# Patient Record
Sex: Female | Born: 1945 | Hispanic: Yes | Marital: Single | Smoking: Former smoker
Health system: Southern US, Community
[De-identification: ages and names within clinical notes are randomized; demographics above are authoritative.]

## PROBLEM LIST (undated history)

## (undated) ENCOUNTER — Emergency Department (HOSPITAL_COMMUNITY): Admission: EM | Payer: Self-pay | Source: Home / Self Care

## (undated) DIAGNOSIS — R011 Cardiac murmur, unspecified: Secondary | ICD-10-CM

## (undated) DIAGNOSIS — M1990 Unspecified osteoarthritis, unspecified site: Secondary | ICD-10-CM

## (undated) DIAGNOSIS — I10 Essential (primary) hypertension: Secondary | ICD-10-CM

## (undated) DIAGNOSIS — F419 Anxiety disorder, unspecified: Secondary | ICD-10-CM

## (undated) HISTORY — DX: Unspecified osteoarthritis, unspecified site: M19.90

## (undated) HISTORY — DX: Anxiety disorder, unspecified: F41.9

## (undated) HISTORY — DX: Essential (primary) hypertension: I10

## (undated) HISTORY — PX: ABDOMINAL HYSTERECTOMY: SHX81

## (undated) HISTORY — DX: Cardiac murmur, unspecified: R01.1

---

## 2014-01-04 DIAGNOSIS — I10 Essential (primary) hypertension: Secondary | ICD-10-CM | POA: Diagnosis not present

## 2014-01-04 DIAGNOSIS — R001 Bradycardia, unspecified: Secondary | ICD-10-CM | POA: Diagnosis not present

## 2014-03-15 ENCOUNTER — Ambulatory Visit: Payer: Self-pay

## 2014-03-15 ENCOUNTER — Telehealth: Payer: Self-pay

## 2014-03-15 ENCOUNTER — Ambulatory Visit (INDEPENDENT_AMBULATORY_CARE_PROVIDER_SITE_OTHER): Payer: Medicare Other

## 2014-03-15 ENCOUNTER — Ambulatory Visit (INDEPENDENT_AMBULATORY_CARE_PROVIDER_SITE_OTHER): Payer: Medicare Other | Admitting: Family Medicine

## 2014-03-15 VITALS — BP 116/70 | HR 62 | Temp 98.1°F | Resp 18 | Ht 63.5 in | Wt 166.0 lb

## 2014-03-15 DIAGNOSIS — R5381 Other malaise: Secondary | ICD-10-CM | POA: Diagnosis not present

## 2014-03-15 DIAGNOSIS — I10 Essential (primary) hypertension: Secondary | ICD-10-CM | POA: Diagnosis not present

## 2014-03-15 DIAGNOSIS — R059 Cough, unspecified: Secondary | ICD-10-CM

## 2014-03-15 DIAGNOSIS — R05 Cough: Secondary | ICD-10-CM

## 2014-03-15 DIAGNOSIS — J101 Influenza due to other identified influenza virus with other respiratory manifestations: Secondary | ICD-10-CM | POA: Diagnosis not present

## 2014-03-15 LAB — POCT CBC
Granulocyte percent: 45.1 %G (ref 37–80)
HCT, POC: 42.3 % (ref 37.7–47.9)
HEMOGLOBIN: 13.7 g/dL (ref 12.2–16.2)
LYMPH, POC: 2.9 (ref 0.6–3.4)
MCH, POC: 30.7 pg (ref 27–31.2)
MCHC: 32.5 g/dL (ref 31.8–35.4)
MCV: 94.3 fL (ref 80–97)
MID (cbc): 0.3 (ref 0–0.9)
MPV: 8.1 fL (ref 0–99.8)
POC Granulocyte: 2.7 (ref 2–6.9)
POC LYMPH PERCENT: 49.7 %L (ref 10–50)
POC MID %: 5.2 %M (ref 0–12)
Platelet Count, POC: 226 10*3/uL (ref 142–424)
RBC: 4.48 M/uL (ref 4.04–5.48)
RDW, POC: 12.8 %
WBC: 5.9 10*3/uL (ref 4.6–10.2)

## 2014-03-15 LAB — POCT INFLUENZA A/B
INFLUENZA A, POC: POSITIVE
INFLUENZA B, POC: NEGATIVE

## 2014-03-15 MED ORDER — HYDROCODONE-HOMATROPINE 5-1.5 MG/5ML PO SYRP
5.0000 mL | ORAL_SOLUTION | Freq: Two times a day (BID) | ORAL | Status: DC | PRN
Start: 2014-03-15 — End: 2015-06-08

## 2014-03-15 MED ORDER — OSELTAMIVIR PHOSPHATE 75 MG PO CAPS: 75.0000 mg | ORAL_CAPSULE | Freq: Two times a day (BID) | ORAL | Status: DC

## 2014-03-15 NOTE — Patient Instructions (Signed)
You do have the flu- use the tamiflu as directed, rest, drink plenty of liquids, and use the cough syrup as needed.   The cough syrup will make you feel sleepy so do not use it when you need to drive.  However it is ok to use ibuprofen or tylenol as needed as well Let me know if you do not feel improved in 2-3 days

## 2014-03-15 NOTE — Progress Notes (Addendum)
[facility] [address], [place] [address] [phone]  Date:  03/15/2014   Name:  Danielle Stone   DOB:  11/13/1945   MRN:  [id number]  PCP:  No PCP Per Patient    Chief Complaint: Chills; Emesis; Generalized Body Aches; Sore Throat; and Cough   History of Present Illness:  Danielle Stone is a 69 y.o. very pleasant female patient who presents with the following:  She is here today with illness- today is Monday and she noted sx on Saturday. She first noted chills, subjective fever, ST and cough.  It hurts a lot to cough. Her body hurts.  She has vomited twice and has also noted diarrhea.   She has tried some tylenol and excedrin for HA.   No medications so far today No sick contacts at home.  She recently [place] from [place]  There are no active problems to display for this patient.   Past Medical History  Diagnosis Date  . Anxiety   . Arthritis   . Heart murmur   . Hypertension     Past Surgical History  Procedure Laterality Date  . Abdominal hysterectomy      History  Substance Use Topics  . Smoking status: Former Games developermoker  . Smokeless tobacco: Not on file  . Alcohol Use: No    Family History  Problem Relation Age of Onset  . Cancer Mother   . Diabetes Mother   . Heart disease Mother   . Hyperlipidemia Mother   . Hypertension Mother   . Diabetes Sister     No Known Allergies  Medication list has been reviewed and updated.  No current outpatient prescriptions on file prior to visit.   No current facility-administered medications on file prior to visit.    Review of Systems:  As per HPI- otherwise negative.   Physical Examination: Filed Vitals:   03/15/14 1315  BP: 116/70  Pulse: 62  Temp: 98.1 F (36.7 C)  Resp: 18   Filed Vitals:   03/15/14 1315  Height: 5' 3.5" (1.613 m)  Weight: 166 lb (75.297 kg)   Body mass index is 28.94 kg/(m^2). Ideal Body Weight: Weight in (lb) to have BMI  = 25: 143.1  GEN: WDWN, NAD, Non-toxic, A & O x 3, overweight HEENT: Atraumatic, Normocephalic. Neck supple. No masses, No LAD.  Bilateral TM wnl, oropharynx normal.  PEERL,EOMI.   Ears and Nose: No external deformity. CV: RRR, No M/G/R. No JVD. No thrill. No extra heart sounds. PULM: CTA B, no wheezes. No retractions. No resp. distress. No accessory muscle use. Chest is congested.   ABD: S, NT, ND. No rebound. No HSM.  Benign exam EXTR: No c/c/e NEURO Normal gait.  PSYCH: Normally interactive. Conversant. Not depressed or anxious appearing.  Calm demeanor.   [facility] reading (PRIMARY) by  Dr. Patsy Lageropland. CXR: negative CHEST 2 VIEW  COMPARISON: None.  FINDINGS: The cardiac shadow is within normal limits. The lungs are well aerated bilaterally. Artifact is noted over the lower aspect of the image. No focal infiltrate or sizable effusion is seen. No bony abnormality is noted.  IMPRESSION: No acute abnormality seen.  Results for orders placed or performed in visit on 03/15/14  POCT CBC  Result Value Ref Range   WBC 5.9 4.6 - 10.2 K/uL   Lymph, poc 2.9 0.6 - 3.4   POC LYMPH PERCENT 49.7 10 - 50 %L   MID (cbc) 0.3 0 - 0.9  POC MID % 5.2 0 - 12 %M   POC Granulocyte 2.7 2 - 6.9   Granulocyte percent 45.1 37 - 80 %G   RBC 4.48 4.04 - 5.48 M/uL   Hemoglobin 13.7 12.2 - 16.2 g/dL   HCT, POC 08.6 57.8 - 47.9 %   MCV 94.3 80 - 97 fL   MCH, POC 30.7 27 - 31.2 pg   MCHC 32.5 31.8 - 35.4 g/dL   RDW, POC 46.9 %   Platelet Count, POC 226 142 - 424 K/uL   MPV 8.1 0 - 99.8 fL  POCT Influenza A/B  Result Value Ref Range   Influenza A, POC Positive    Influenza B, POC Negative       Assessment and Plan: Influenza A - Plan: oseltamivir (TAMIFLU) 75 MG capsule  Cough - Plan: POCT CBC, POCT Influenza A/B, DG Chest 2 View, HYDROcodone-homatropine (HYCODAN) 5-1.5 MG/5ML syrup  Malaise - Plan: POCT CBC, POCT Influenza A/B, DG Chest 2 View  Treat for the flu with tamiflu and hycodan as  needed See patient instructions for more details.   She will let me know if not better soon  Signed Abbe Amsterdam, MD

## 2014-03-15 NOTE — Telephone Encounter (Signed)
Patient lost her gold ear ring today (seen at [address])  If found call [phone]

## 2014-03-19 NOTE — Telephone Encounter (Signed)
Called patient to let her know the gold earring was not found at [address].

## 2014-07-29 DIAGNOSIS — J209 Acute bronchitis, unspecified: Secondary | ICD-10-CM | POA: Diagnosis not present

## 2014-07-29 DIAGNOSIS — I10 Essential (primary) hypertension: Secondary | ICD-10-CM | POA: Diagnosis not present

## 2014-07-29 DIAGNOSIS — E785 Hyperlipidemia, unspecified: Secondary | ICD-10-CM | POA: Diagnosis not present

## 2014-07-29 DIAGNOSIS — R5382 Chronic fatigue, unspecified: Secondary | ICD-10-CM | POA: Diagnosis not present

## 2014-07-29 DIAGNOSIS — J029 Acute pharyngitis, unspecified: Secondary | ICD-10-CM | POA: Diagnosis not present

## 2014-07-29 DIAGNOSIS — R05 Cough: Secondary | ICD-10-CM | POA: Diagnosis not present

## 2014-07-29 DIAGNOSIS — R5383 Other fatigue: Secondary | ICD-10-CM | POA: Diagnosis not present

## 2015-06-02 ENCOUNTER — Emergency Department (HOSPITAL_COMMUNITY)
Admission: EM | Admit: 2015-06-02 | Discharge: 2015-06-02 | Disposition: A | Discharge status: Home / Self Care | Payer: Medicare Other | Attending: Emergency Medicine | Admitting: Emergency Medicine

## 2015-06-02 ENCOUNTER — Emergency Department (HOSPITAL_COMMUNITY): Payer: Medicare Other

## 2015-06-02 ENCOUNTER — Encounter (HOSPITAL_COMMUNITY): Payer: Self-pay | Admitting: *Deleted

## 2015-06-02 DIAGNOSIS — R001 Bradycardia, unspecified: Secondary | ICD-10-CM | POA: Diagnosis not present

## 2015-06-02 DIAGNOSIS — R030 Elevated blood-pressure reading, without diagnosis of hypertension: Secondary | ICD-10-CM | POA: Diagnosis not present

## 2015-06-02 DIAGNOSIS — I10 Essential (primary) hypertension: Secondary | ICD-10-CM | POA: Insufficient documentation

## 2015-06-02 DIAGNOSIS — R42 Dizziness and giddiness: Secondary | ICD-10-CM | POA: Diagnosis not present

## 2015-06-02 DIAGNOSIS — Z79899 Other long term (current) drug therapy: Secondary | ICD-10-CM | POA: Diagnosis not present

## 2015-06-02 DIAGNOSIS — R011 Cardiac murmur, unspecified: Secondary | ICD-10-CM | POA: Insufficient documentation

## 2015-06-02 DIAGNOSIS — I159 Secondary hypertension, unspecified: Secondary | ICD-10-CM

## 2015-06-02 DIAGNOSIS — Z8659 Personal history of other mental and behavioral disorders: Secondary | ICD-10-CM | POA: Diagnosis not present

## 2015-06-02 DIAGNOSIS — R0602 Shortness of breath: Secondary | ICD-10-CM | POA: Diagnosis not present

## 2015-06-02 DIAGNOSIS — Z87891 Personal history of nicotine dependence: Secondary | ICD-10-CM | POA: Insufficient documentation

## 2015-06-02 DIAGNOSIS — R51 Headache: Secondary | ICD-10-CM | POA: Insufficient documentation

## 2015-06-02 DIAGNOSIS — M1990 Unspecified osteoarthritis, unspecified site: Secondary | ICD-10-CM | POA: Insufficient documentation

## 2015-06-02 LAB — CBC WITH DIFFERENTIAL/PLATELET
BASOS ABS: 0 10*3/uL (ref 0.0–0.1)
BASOS PCT: 0 %
Eosinophils Absolute: 0.3 10*3/uL (ref 0.0–0.7)
Eosinophils Relative: 3 %
HEMATOCRIT: 39.9 % (ref 36.0–46.0)
HEMOGLOBIN: 13.2 g/dL (ref 12.0–15.0)
Lymphocytes Relative: 39 %
Lymphs Abs: 3.3 10*3/uL (ref 0.7–4.0)
MCH: 30.7 pg (ref 26.0–34.0)
MCHC: 33.1 g/dL (ref 30.0–36.0)
MCV: 92.8 fL (ref 78.0–100.0)
Monocytes Absolute: 0.4 10*3/uL (ref 0.1–1.0)
Monocytes Relative: 5 %
NEUTROS ABS: 4.6 10*3/uL (ref 1.7–7.7)
NEUTROS PCT: 53 %
Platelets: 261 10*3/uL (ref 150–400)
RBC: 4.3 MIL/uL (ref 3.87–5.11)
RDW: 12.3 % (ref 11.5–15.5)
WBC: 8.6 10*3/uL (ref 4.0–10.5)

## 2015-06-02 LAB — BASIC METABOLIC PANEL
ANION GAP: 12 (ref 5–15)
BUN: 11 mg/dL (ref 6–20)
CALCIUM: 9.3 mg/dL (ref 8.9–10.3)
CO2: 25 mmol/L (ref 22–32)
Chloride: 104 mmol/L (ref 101–111)
Creatinine, Ser: 0.85 mg/dL (ref 0.44–1.00)
GFR calc non Af Amer: >60 mL/min (ref >60)
Glucose, Bld: 100 mg/dL — ABNORMAL HIGH (ref 65–99)
Potassium: 3.7 mmol/L (ref 3.5–5.1)
SODIUM: 141 mmol/L (ref 135–145)

## 2015-06-02 MED ORDER — DIPHENHYDRAMINE HCL 50 MG/ML IJ SOLN: 25.0000 mg | Freq: Once | INTRAMUSCULAR | Status: DC

## 2015-06-02 MED ORDER — SODIUM CHLORIDE 0.9 % IV BOLUS (SEPSIS): 1000.0000 mL | Freq: Once | INTRAVENOUS | Status: DC

## 2015-06-02 MED ORDER — METOCLOPRAMIDE HCL 5 MG/ML IJ SOLN: 10.0000 mg | Freq: Once | INTRAMUSCULAR | Status: DC

## 2015-06-02 MED ORDER — LORAZEPAM 2 MG/ML IJ SOLN
0.5000 mg | Freq: Once | INTRAMUSCULAR | Status: AC
  Administered 2015-06-02: 0.5 mg via INTRAVENOUS
  Filled 2015-06-02: qty 1

## 2015-06-02 MED ORDER — LISINOPRIL-HYDROCHLOROTHIAZIDE 20-25 MG PO TABS: 1.0000 | ORAL_TABLET | Freq: Every day | ORAL | Status: AC

## 2015-06-02 MED ORDER — GADOBENATE DIMEGLUMINE 529 MG/ML IV SOLN
15.0000 mL | Freq: Once | INTRAVENOUS | Status: AC | PRN
  Administered 2015-06-02: 15 mL via INTRAVENOUS

## 2015-06-02 NOTE — ED Provider Notes (Signed)
[place] less sided temporal headache sudden onset 5 PM today accompanied by nausea and vertigo. Headache is presently gone and she is asymptomatic. She admits to noncompliance with her lisinopril-HCTZ for 10 days as she ran out. On exam patient alert no distress HEENT exam no facial asymmetry neck supple trachea midline no bruit lungs clearheart regular rate and rhythm abdomen obese, nontender extremities no edema. Neurologic cranial nerves II through XII grossly intact. She moves all extremities motor is 5 over 5 overall upon attempting to stand she becomes extremely vertiginous and could not stand  Doug SouSam Amarionna Arca, MD 06/02/15 2336

## 2015-06-02 NOTE — ED Provider Notes (Signed)
CSN: [id number]     Arrival date & time 06/02/15  1934 History   First MD Initiated Contact with Patient 06/02/15 1937     Chief Complaint  Patient presents with  . Hypertension  . Dizziness     (Consider location/radiation/quality/duration/timing/severity/associated sxs/prior Treatment) Patient is a 70 y.o. female presenting with general illness. The history is provided by the patient.  Illness Location:  Headache, lightheadedness Severity:  Moderate Onset quality:  Gradual Duration:  1 day Timing:  Constant Progression:  Unchanged Chronicity:  New Context:  History of hypertension. Has not taken her medications for 10 days. 2 days of lightheadedness. Onset of headache today. Endorses shortness of breath, no chest pain. EMS called. Reported that multiple measurements had systolic blood pressure greater than 290. Associated symptoms: headaches and shortness of breath   Associated symptoms: no abdominal pain, no chest pain, no cough, no diarrhea, no fever, no nausea and no vomiting     Past Medical History  Diagnosis Date  . Anxiety   . Arthritis   . Heart murmur   . Hypertension    Past Surgical History  Procedure Laterality Date  . Abdominal hysterectomy     Family History  Problem Relation Age of Onset  . Cancer Mother   . Diabetes Mother   . Heart disease Mother   . Hyperlipidemia Mother   . Hypertension Mother   . Diabetes Sister    Social History  Substance Use Topics  . Smoking status: Former Games developer  . Smokeless tobacco: None  . Alcohol Use: No   OB History    No data available     Review of Systems  Constitutional: Negative for fever and chills.  Eyes: Negative for visual disturbance.  Respiratory: Positive for shortness of breath. Negative for cough.   Cardiovascular: Negative for chest pain.  Gastrointestinal: Negative for nausea, vomiting, abdominal pain and diarrhea.  Musculoskeletal: Negative for back pain, neck pain and neck stiffness.   Neurological: Positive for light-headedness and headaches. Negative for syncope, facial asymmetry, speech difficulty, weakness and numbness.  All other systems reviewed and are negative.     Allergies  Review of patient's allergies indicates no known allergies.  Home Medications   Prior to Admission medications   Medication Sig Start Date End Date Taking? Authorizing Provider  HYDROcodone-homatropine (HYCODAN) 5-1.5 MG/5ML syrup Take 5 mLs by mouth 2 (two) times daily as needed for cough. 03/15/14   Gwenlyn Found Copland, MD  lisinopril-hydrochlorothiazide (PRINZIDE,ZESTORETIC) 20-25 MG per tablet Take 1 tablet by mouth daily.    Historical Provider, MD  oseltamivir (TAMIFLU) 75 MG capsule Take 1 capsule (75 mg total) by mouth 2 (two) times daily. 03/15/14   Gwenlyn Found Copland, MD   BP 168/98 mmHg  Pulse 48  Temp(Src) 97.4 F (36.3 C) (Oral)  SpO2 95% Physical Exam  Constitutional: She is oriented to person, place, and time. She appears well-developed and well-nourished. No distress.  HENT:  Head: Normocephalic and atraumatic.  Eyes: EOM are normal. Pupils are equal, round, and reactive to light.  Neck: Normal range of motion. No rigidity. Normal range of motion present.  Cardiovascular: Regular rhythm.  Bradycardia present.   Pulmonary/Chest: No tachypnea. No respiratory distress.  Abdominal: Soft. Normal appearance. There is no tenderness.  Neurological: She is alert and oriented to person, place, and time. GCS eye subscore is 4. GCS verbal subscore is 5. GCS motor subscore is 6.  Skin: Skin is warm and dry.    ED Course  Procedures (  including critical care time) Labs Review Labs Reviewed - No data to display  Imaging Review No results found. I have personally reviewed and evaluated these images and lab results as part of my medical decision-making.   EKG Interpretation   Date/Time:  Thursday Jun 02 2015 19:39:17 EDT Ventricular Rate:  49 PR Interval:  141 QRS Duration:  94 QT Interval:  476 QTC Calculation: 430 R Axis:   44 Text Interpretation:  Sinus bradycardia Borderline T abnormalities,  anterior leads No old tracing to compare Confirmed by Ethelda ChickJACUBOWITZ  MD, SAM  [phone]([id number]) on 06/02/2015 7:41:24 PM      MDM   Final diagnoses:  Secondary hypertension, unspecified  Lightheadedness    70 year old female with only medical history of hypertension presenting with lightheadedness and headache. Hasn't been taking her medications. Lightheadedness for 1 day. Abrupt onset of headache 3 hours prior to arrival. Denies vision changes. Denies fevers or chills. Per EMS had a blood pressure of systolic greater than 290. Blood pressure here in the systolic 160s on arrival. Mild bradycardia. Endorses shortness of breath, no chest pain. Did not receive any interventions. Overall the patient appears well. No meningeal signs. Doubt sepsis or meningitis. No focal neurological deficits. Headache now resolved. I tried to ambulate the patient, unable to stand 2/2 to lightheadedness, vertigo.  Getting a MRI head to evaluate for intracranial bleed versus posterior infarct, and chest x-ray. EKG pending. We'll give the patient medications for her headache should it return.Maryclare Labrador. We'll reevaluate after those return.  MRI showed no acute pathology. No evidence of intracranial bleed or acute infarct. Doubt CVA.  I reevaluated the patient. She is reporting she's had resolution of her lightheadedness and vertiginous symptoms. Does not have a headache either. Vital signs are stable. Able to ambulate throughout the emergency department without issue.  After discussion with the patient and family member the patient feels comfortable with going home. We'll give her prescription for her antihypertensive. We'll have her follow-up with family primary care doctor as soon as possible. Patient is also mentioning starting some dieting pills recently. Strongly encouraged her to stop taking these.  Strict  return precautions provided. Patient discharged in stable condition.  Lindalou HoseSean O'Rourke, MD 06/02/15 2349  Doug SouSam Jacubowitz, MD 06/03/15 [id number]

## 2015-06-02 NOTE — ED Notes (Signed)
Pt presents via [facility] from home c/o dizziness andHTN.  Pt has been out of medications x 10 days d/t losing medicare card.  Initial BP 290/166 by EMS and 290/130, 224 palp.  Last BP-162/72, 40s SB.   No neuro deficits noted. Pt a x 4, NAD.

## 2015-06-08 ENCOUNTER — Emergency Department (HOSPITAL_COMMUNITY)
Admission: EM | Admit: 2015-06-08 | Discharge: 2015-06-09 | Disposition: A | Discharge status: Home / Self Care | Payer: Medicare Other | Attending: Emergency Medicine | Admitting: Emergency Medicine

## 2015-06-08 ENCOUNTER — Encounter (HOSPITAL_COMMUNITY): Payer: Self-pay | Admitting: Emergency Medicine

## 2015-06-08 DIAGNOSIS — Z87891 Personal history of nicotine dependence: Secondary | ICD-10-CM | POA: Insufficient documentation

## 2015-06-08 DIAGNOSIS — Z79899 Other long term (current) drug therapy: Secondary | ICD-10-CM | POA: Diagnosis not present

## 2015-06-08 DIAGNOSIS — R42 Dizziness and giddiness: Secondary | ICD-10-CM

## 2015-06-08 DIAGNOSIS — R011 Cardiac murmur, unspecified: Secondary | ICD-10-CM | POA: Diagnosis not present

## 2015-06-08 DIAGNOSIS — M1990 Unspecified osteoarthritis, unspecified site: Secondary | ICD-10-CM | POA: Diagnosis not present

## 2015-06-08 DIAGNOSIS — Z8659 Personal history of other mental and behavioral disorders: Secondary | ICD-10-CM | POA: Diagnosis not present

## 2015-06-08 DIAGNOSIS — R5383 Other fatigue: Secondary | ICD-10-CM | POA: Diagnosis not present

## 2015-06-08 DIAGNOSIS — R51 Headache: Secondary | ICD-10-CM | POA: Insufficient documentation

## 2015-06-08 DIAGNOSIS — I10 Essential (primary) hypertension: Secondary | ICD-10-CM | POA: Diagnosis not present

## 2015-06-08 DIAGNOSIS — R0602 Shortness of breath: Secondary | ICD-10-CM | POA: Diagnosis not present

## 2015-06-08 LAB — CBC WITH DIFFERENTIAL/PLATELET
BASOS ABS: 0 10*3/uL (ref 0.0–0.1)
Basophils Relative: 0 %
EOS ABS: 0.4 10*3/uL (ref 0.0–0.7)
EOS PCT: 4 %
HCT: 42.8 % (ref 36.0–46.0)
Hemoglobin: 14.4 g/dL (ref 12.0–15.0)
LYMPHS PCT: 48 %
Lymphs Abs: 4.9 10*3/uL — ABNORMAL HIGH (ref 0.7–4.0)
MCH: 31.8 pg (ref 26.0–34.0)
MCHC: 33.6 g/dL (ref 30.0–36.0)
MCV: 94.5 fL (ref 78.0–100.0)
Monocytes Absolute: 0.7 10*3/uL (ref 0.1–1.0)
Monocytes Relative: 7 %
NEUTROS ABS: 4.2 10*3/uL (ref 1.7–7.7)
NEUTROS PCT: 41 %
PLATELETS: 317 10*3/uL (ref 150–400)
RBC: 4.53 MIL/uL (ref 3.87–5.11)
RDW: 12.3 % (ref 11.5–15.5)
WBC: 10.2 10*3/uL (ref 4.0–10.5)

## 2015-06-08 LAB — BASIC METABOLIC PANEL
ANION GAP: 11 (ref 5–15)
BUN: 15 mg/dL (ref 6–20)
CO2: 30 mmol/L (ref 22–32)
Calcium: 9.7 mg/dL (ref 8.9–10.3)
Chloride: 98 mmol/L — ABNORMAL LOW (ref 101–111)
Creatinine, Ser: 0.95 mg/dL (ref 0.44–1.00)
Glucose, Bld: 111 mg/dL — ABNORMAL HIGH (ref 65–99)
POTASSIUM: 3.8 mmol/L (ref 3.5–5.1)
SODIUM: 139 mmol/L (ref 135–145)

## 2015-06-08 NOTE — ED Notes (Signed)
Pt. reports dizziness with mild headache and nausea onset this week , [facility] diagnosed with vertigo , pt. articulated MRI done here last Thursday diagnosed with hypertension discharged home with prescription antihypertensive .

## 2015-06-08 NOTE — ED Provider Notes (Signed)
CSN: [id number]     Arrival date & time 06/08/15  2108 History   First MD Initiated Contact with Patient 06/08/15 2256     Chief Complaint  Patient presents with  . Dizziness    Patient is a 70 y.o. female presenting with dizziness. The history is provided by the patient and a relative.  Dizziness Quality:  Head spinning Severity:  Moderate Onset quality:  Gradual Duration:  6 days Timing:  Intermittent Progression:  Improving Chronicity:  New Worsened by:  Nothing Associated symptoms: headaches and shortness of breath   Associated symptoms: no chest pain, no hearing loss, no syncope and no vomiting   Risk factors: no hx of stroke    Patient presents for continued dizziness She reports that she was seen on 5/11 for HTN and dizziness She had extensive workup including MRI and was discharged home Since that time she has improved but still has episodes of dizziness No syncope She has mild HA She has mild blurred vision but no diplopia No CP/SOB No focal weakness She reports mild SOB at times She reports fatigue  She was seen at her PCP clinic today, told she had vertigo and told to go to ER to get her results from last week  Past Medical History  Diagnosis Date  . Anxiety   . Arthritis   . Heart murmur   . Hypertension    Past Surgical History  Procedure Laterality Date  . Abdominal hysterectomy     Family History  Problem Relation Age of Onset  . Cancer Mother   . Diabetes Mother   . Heart disease Mother   . Hyperlipidemia Mother   . Hypertension Mother   . Diabetes Sister    Social History  Substance Use Topics  . Smoking status: Former Games developer  . Smokeless tobacco: None  . Alcohol Use: No   OB History    No data available     Review of Systems  Constitutional: Positive for fatigue. Negative for fever.  HENT: Negative for hearing loss.   Respiratory: Positive for shortness of breath.   Cardiovascular: Negative for chest pain and syncope.   Gastrointestinal: Negative for vomiting.  Genitourinary: Negative for dysuria.  Neurological: Positive for dizziness and headaches. Negative for syncope.  All other systems reviewed and are negative.     Allergies  Review of patient's allergies indicates no known allergies.  Home Medications   Prior to Admission medications   Medication Sig Start Date End Date Taking? Authorizing Provider  HYDROcodone-homatropine (HYCODAN) 5-1.5 MG/5ML syrup Take 5 mLs by mouth 2 (two) times daily as needed for cough. 03/15/14   Gwenlyn Found Copland, MD  lisinopril-hydrochlorothiazide (PRINZIDE,ZESTORETIC) 20-25 MG tablet Take 1 tablet by mouth daily. 06/02/15   Lindalou Hose, MD  oseltamivir (TAMIFLU) 75 MG capsule Take 1 capsule (75 mg total) by mouth 2 (two) times daily. 03/15/14   Gwenlyn Found Copland, MD   BP 130/82 mmHg  Pulse 55  Temp(Src) 97.4 F (36.3 C) (Oral)  Resp 16  Ht  (1.626 m)  Wt 76.25 kg  BMI 28.84 kg/m2  SpO2 97% Physical Exam CONSTITUTIONAL: Well developed/well nourished HEAD: Normocephalic/atraumatic EYES: EOMI/PERRL, no nystagmus,  no ptosis ENMT: Mucous membranes moist NECK: supple no meningeal signs, no bruits CV: S1/S2 noted, no murmurs/rubs/gallops noted LUNGS: Lungs are clear to auscultation bilaterally, no apparent distress ABDOMEN: soft, nontender, no rebound or guarding GU:no cva tenderness NEURO:Awake/alert, face symmetric, no arm or leg drift is noted Equal 5/5 strength with  shoulder abduction, elbow flex/extension, wrist flex/extension in upper extremities and equal hand grips bilaterally Equal 5/5 strength with hip flexion,knee flex/extension, foot dorsi/plantar flexion Cranial nerves 3/4/5/6/07/30/08/11/12 tested and intact Gait normal without ataxia No past pointing Sensation to light touch intact in all extremities EXTREMITIES: pulses normal, full ROM SKIN: warm, color normal PSYCH: no abnormalities of mood noted  ED Course  Procedures  Labs  Review Labs Reviewed  CBC WITH DIFFERENTIAL/PLATELET - Abnormal; Notable for the following:    Lymphs Abs 4.9 (*)    All other components within normal limits  BASIC METABOLIC PANEL - Abnormal; Notable for the following:    Chloride 98 (*)    Glucose, Bld 111 (*)    All other components within normal limits  TROPONIN I     EKG Interpretation   Date/Time:  Wednesday Jun 08 2015 23:42:58 EDT Ventricular Rate:  49 PR Interval:  150 QRS Duration: 100 QT Interval:  456 QTC Calculation: 412 R Axis:   34 Text Interpretation:  Sinus bradycardia nonspecific st changes No previous  ECGs available Confirmed by Bebe ShaggyWICKLINE  MD, Lowell Mcgurk ([id number]) on 06/08/2015  11:48:03 PM     11:43 PM Pt had negative MRI last week She is improved but still having episodes of vertigo Due to age, and reported fatigue, will also check EKG and troponin   Labs reassuring Pt well appearing She has sinus bradycardia but she reports this is chronic Will give Rx for antivert as this is likely vertigo  MDM   Final diagnoses:  Vertigo    Nursing notes including past medical history and social history reviewed and considered in documentation Labs/vital reviewed myself and considered during evaluation Previous records reviewed and considered     Zadie Rhineonald Charlette Hennings, MD 06/09/15 [phone]

## 2015-06-09 LAB — I-STAT TROPONIN, ED: Troponin i, poc: 0 ng/mL (ref 0.00–0.08)

## 2015-06-09 MED ORDER — MECLIZINE HCL 25 MG PO TABS
25.0000 mg | ORAL_TABLET | Freq: Three times a day (TID) | ORAL | Status: AC | PRN
Start: 2015-06-09 — End: ?

## 2015-07-04 DIAGNOSIS — I10 Essential (primary) hypertension: Secondary | ICD-10-CM | POA: Diagnosis not present

## 2015-08-23 DIAGNOSIS — R7303 Prediabetes: Secondary | ICD-10-CM | POA: Diagnosis not present

## 2015-08-23 DIAGNOSIS — F321 Major depressive disorder, single episode, moderate: Secondary | ICD-10-CM | POA: Diagnosis not present

## 2015-08-23 DIAGNOSIS — I10 Essential (primary) hypertension: Secondary | ICD-10-CM | POA: Diagnosis not present

## 2015-11-21 DIAGNOSIS — Z23 Encounter for immunization: Secondary | ICD-10-CM | POA: Diagnosis not present

## 2015-11-22 DIAGNOSIS — F321 Major depressive disorder, single episode, moderate: Secondary | ICD-10-CM | POA: Diagnosis not present

## 2015-11-22 DIAGNOSIS — G4720 Circadian rhythm sleep disorder, unspecified type: Secondary | ICD-10-CM | POA: Diagnosis not present

## 2016-06-08 DIAGNOSIS — Z0000 Encounter for general adult medical examination without abnormal findings: Secondary | ICD-10-CM | POA: Diagnosis not present

## 2016-06-08 DIAGNOSIS — Z23 Encounter for immunization: Secondary | ICD-10-CM | POA: Diagnosis not present

## 2016-06-08 DIAGNOSIS — F321 Major depressive disorder, single episode, moderate: Secondary | ICD-10-CM | POA: Diagnosis not present

## 2016-06-08 DIAGNOSIS — E785 Hyperlipidemia, unspecified: Secondary | ICD-10-CM | POA: Diagnosis not present

## 2016-06-08 DIAGNOSIS — I10 Essential (primary) hypertension: Secondary | ICD-10-CM | POA: Diagnosis not present

## 2016-06-08 DIAGNOSIS — K5901 Slow transit constipation: Secondary | ICD-10-CM | POA: Diagnosis not present

## 2016-06-08 DIAGNOSIS — R7303 Prediabetes: Secondary | ICD-10-CM | POA: Diagnosis not present

## 2016-06-13 ENCOUNTER — Other Ambulatory Visit: Payer: Self-pay | Admitting: Family Medicine

## 2016-06-13 DIAGNOSIS — Z1231 Encounter for screening mammogram for malignant neoplasm of breast: Secondary | ICD-10-CM

## 2016-07-03 DIAGNOSIS — Z1211 Encounter for screening for malignant neoplasm of colon: Secondary | ICD-10-CM | POA: Diagnosis not present

## 2016-07-03 DIAGNOSIS — K219 Gastro-esophageal reflux disease without esophagitis: Secondary | ICD-10-CM | POA: Diagnosis not present

## 2016-07-20 DIAGNOSIS — E785 Hyperlipidemia, unspecified: Secondary | ICD-10-CM | POA: Diagnosis not present

## 2016-07-20 DIAGNOSIS — F321 Major depressive disorder, single episode, moderate: Secondary | ICD-10-CM | POA: Diagnosis not present

## 2016-09-24 IMAGING — MR MR HEAD WO/W CM
10 of 12 series · 34 of 48 positions shown · IV contrast (multihance)
Comparison: None.

CLINICAL DATA: Dizziness, vertigo. History of hypertension, out of
medication.

EXAM:
MRI HEAD WITHOUT AND WITH CONTRAST
TECHNIQUE: Multiplanar, multiecho pulse sequences of the brain and surrounding
structures were obtained without and with intravenous contrast.
CONTRAST:  15mL MULTIHANCE GADOBENATE DIMEGLUMINE 529 MG/ML IV SOLN

[Series 3: T1 · sagittal · 5.0mm · 0.47mm/px · 3 of 23 slices shown]
[im 1/23]
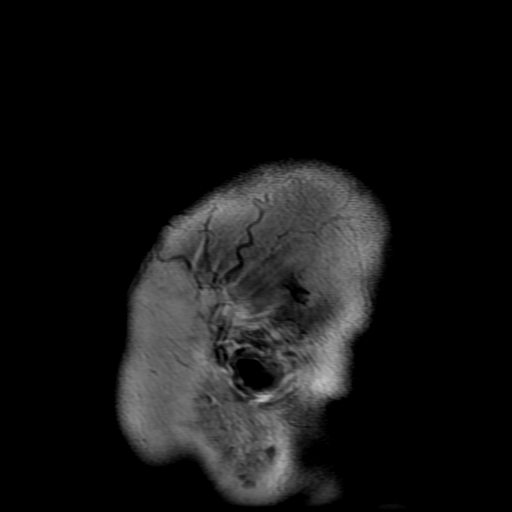
[im 12/23]
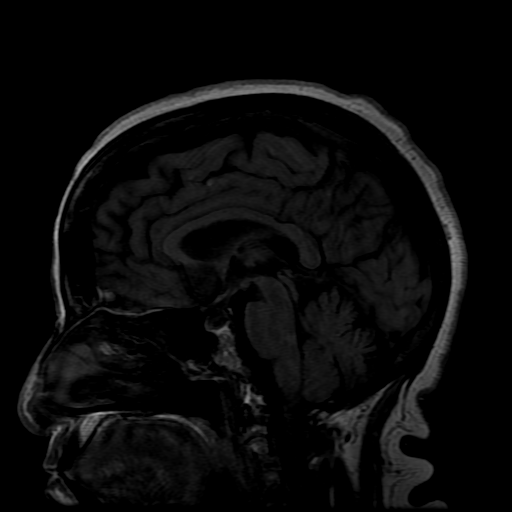
[im 23/23]
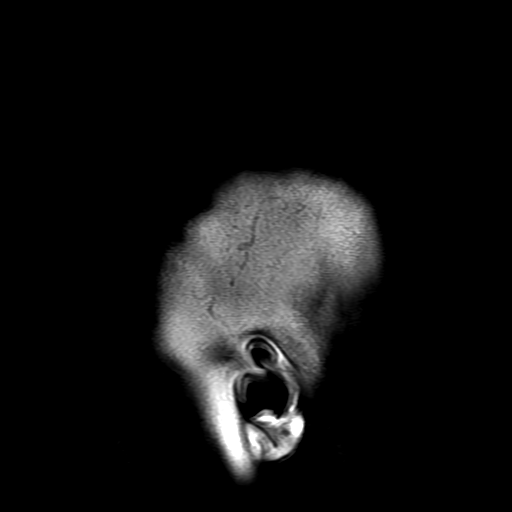

[Series 4: DWI · axial · 3.0mm · 1.09mm/px · z∈[-85,+53]mm · 9 of 94 slices shown (1 of 4)]
[im 1/94]
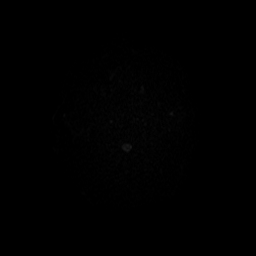
[im 12/94]
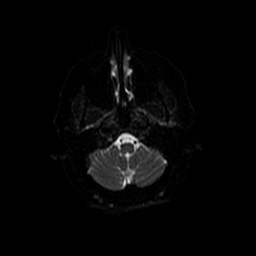
[im 24/94]
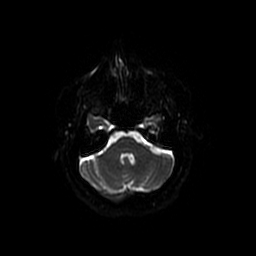
[im 35/94]
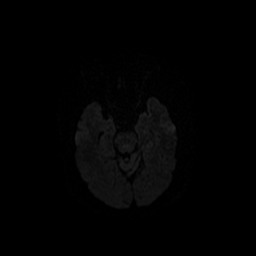
[im 47/94]
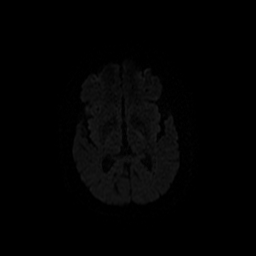
[im 59/94]
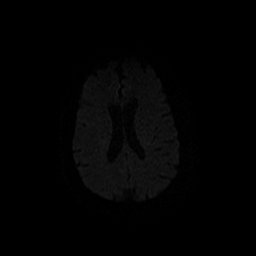
[im 70/94]
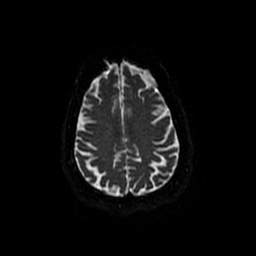
[im 82/94]
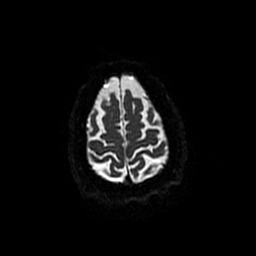
[im 94/94]
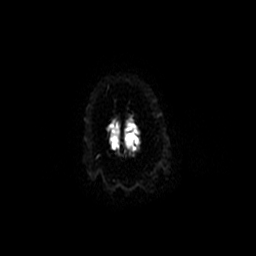

[Series 5: DWI · coronal · 5.0mm · 1.09mm/px · 6 of 66 slices shown (2 of 4)]
[im 1/66]
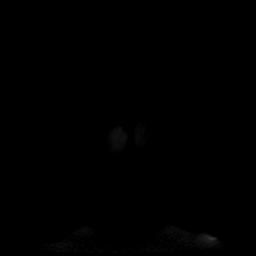
[im 14/66]
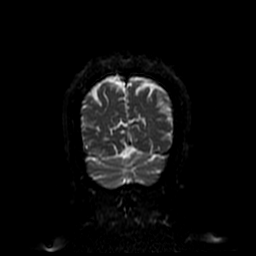
[im 27/66]
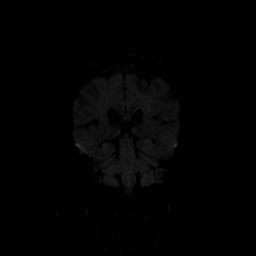
[im 40/66]
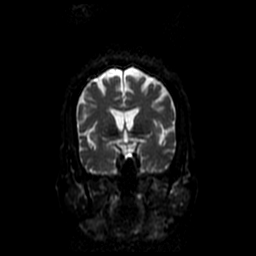
[im 53/66]
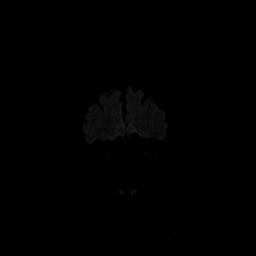
[im 66/66]
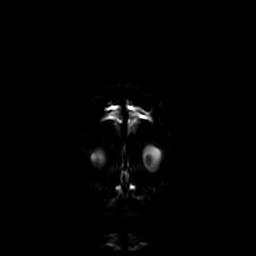

[Series 6: T2 · axial · 5.0mm · 0.47mm/px · z∈[-88,+56]mm · 2 of 25 slices shown]
[im 1/25]
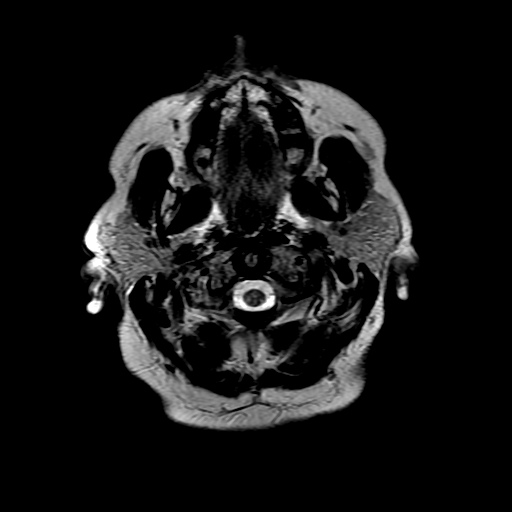
[im 25/25]
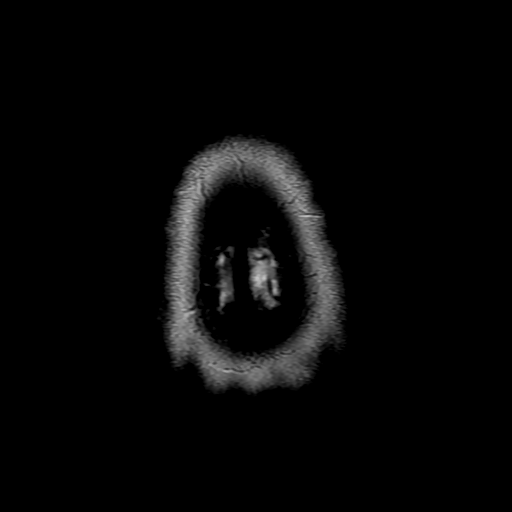

[Series 7: FLAIR · axial · 5.0mm · 0.47mm/px · z∈[-88,+56]mm · 2 of 25 slices shown]
[im 1/25]
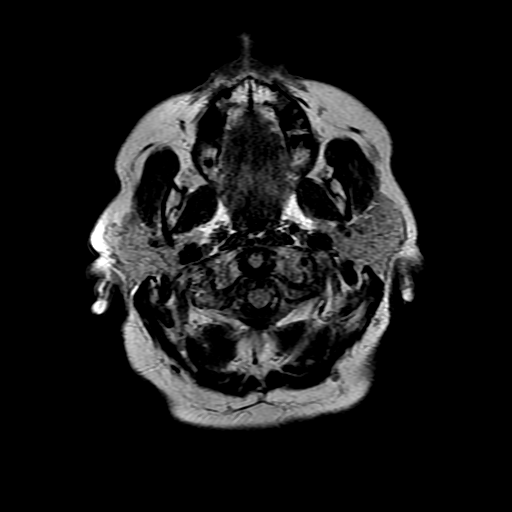
[im 25/25]
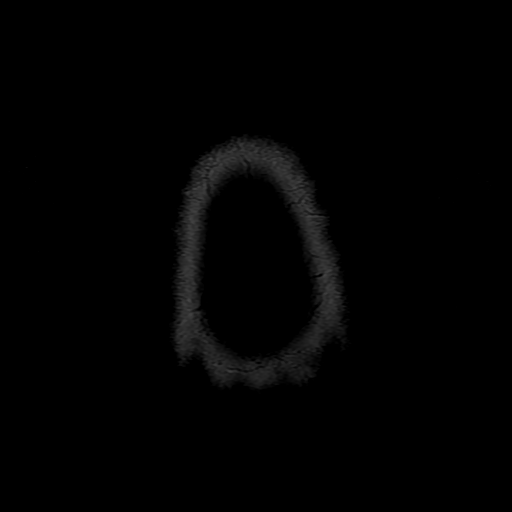

[Series 8: ax mpgr · axial · 5.0mm · 0.47mm/px · 1 of 25 slices shown]
[im 1/25]
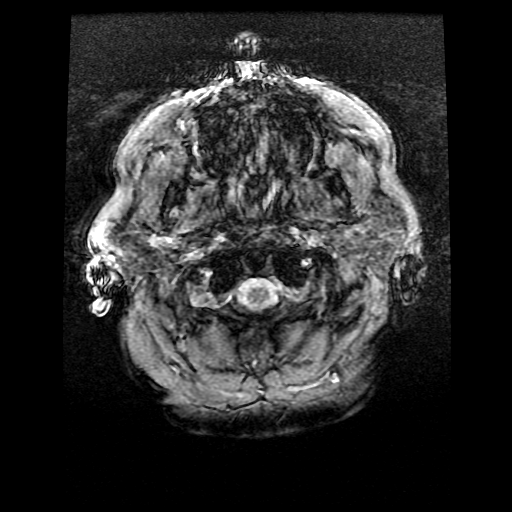

[Series 10: T2 post-contrast · coronal · 5.0mm · 0.39mm/px · 2 of 27 slices shown]
[im 1/27]
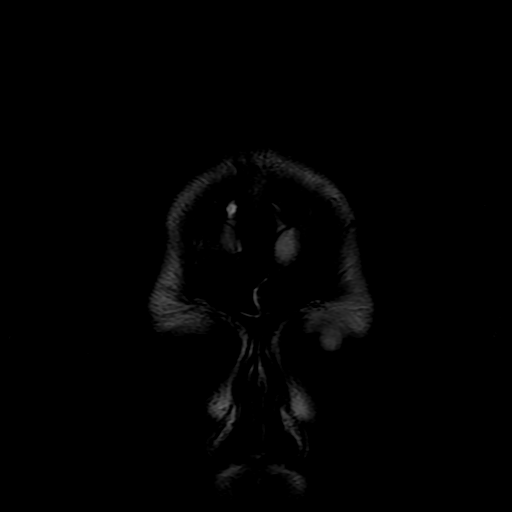
[im 27/27]
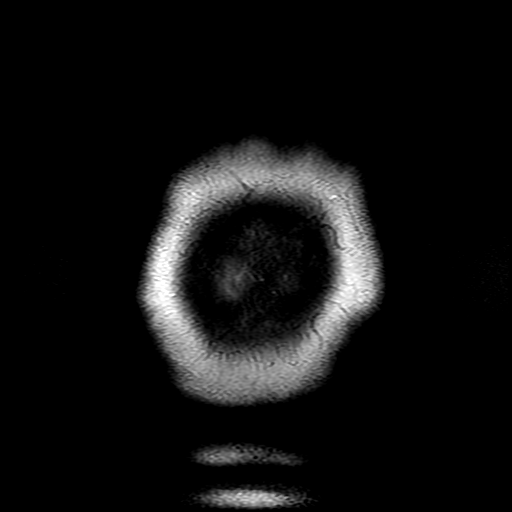

[Series 12: T1 post-contrast · coronal · 5.0mm · 0.39mm/px · 2 of 27 slices shown]
[im 1/27]
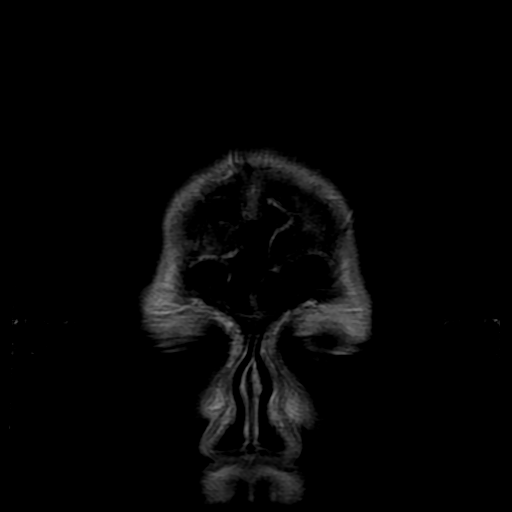
[im 27/27]
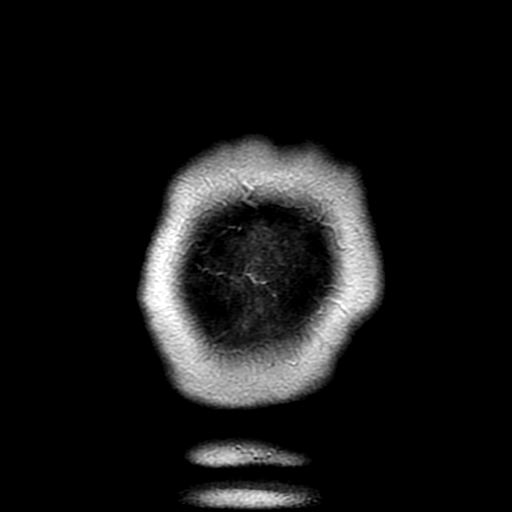

[Series 400: DWI · axial · 3.0mm · 1.09mm/px · z∈[-85,+53]mm · 4 of 47 slices shown (3 of 4)]
[im 1/47]
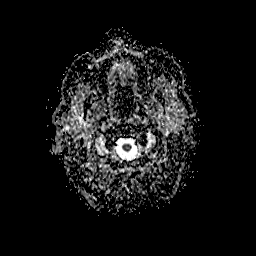
[im 16/47]
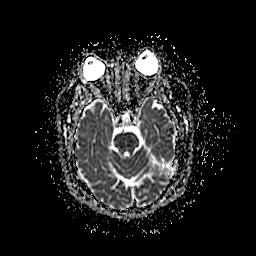
[im 31/47]
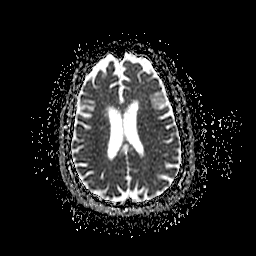
[im 47/47]
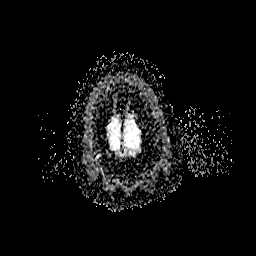

[Series 500: DWI · coronal · 5.0mm · 1.09mm/px · 3 of 33 slices shown (4 of 4)]
[im 1/33]
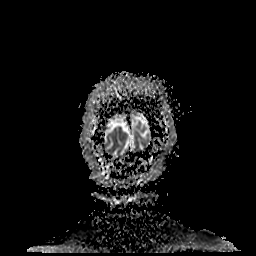
[im 17/33]
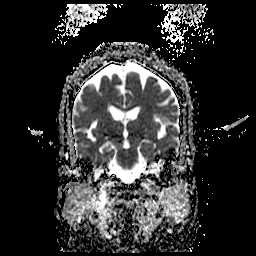
[im 33/33]
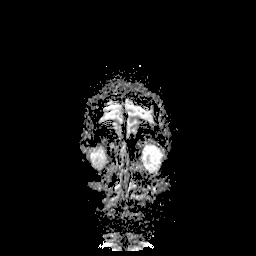

[34 of 48 positions shown; findings below may reference images not displayed]

FINDINGS: INTRACRANIAL CONTENTS: No reduced diffusion to suggest acute
ischemia. No susceptibility artifact to suggest hemorrhage. The
ventricles and sulci are normal for patient's age. A few scattered
subcentimeter supratentorial white matter FLAIR T2 hyperintensities
are less than expected for age, most commonly seen with chronic
small vessel ischemic disease. No suspicious parenchymal signal,
mass lesions, mass effect. No abnormal intraparenchymal or
extra-axial enhancement. No abnormal extra-axial fluid collections.
No extra-axial masses. Normal major intracranial vascular flow voids
present at skull base.

ORBITS: The included ocular globes and orbital contents are
non-suspicious.

SINUSES: The mastoid air-cells and included paranasal sinuses are
well-aerated.

SKULL/SOFT TISSUES: No abnormal sellar expansion. No suspicious
calvarial bone marrow signal. Craniocervical junction maintained.
IMPRESSION: Normal MRI of the brain for age, with and without contrast.

## 2016-11-05 DIAGNOSIS — Z23 Encounter for immunization: Secondary | ICD-10-CM | POA: Diagnosis not present

## 2018-10-09 ENCOUNTER — Other Ambulatory Visit: Payer: Self-pay | Admitting: Family Medicine

## 2018-10-09 DIAGNOSIS — Z780 Asymptomatic menopausal state: Secondary | ICD-10-CM

## 2020-03-21 ENCOUNTER — Ambulatory Visit: Payer: Medicare Other | Admitting: Gastroenterology

## 2020-04-20 ENCOUNTER — Ambulatory Visit: Payer: Medicare Other | Admitting: Gastroenterology

## 2020-11-21 DIAGNOSIS — J45909 Unspecified asthma, uncomplicated: Secondary | ICD-10-CM | POA: Diagnosis not present

## 2020-11-21 DIAGNOSIS — N183 Chronic kidney disease, stage 3 unspecified: Secondary | ICD-10-CM | POA: Diagnosis not present

## 2020-11-21 DIAGNOSIS — M8580 Other specified disorders of bone density and structure, unspecified site: Secondary | ICD-10-CM | POA: Diagnosis not present

## 2020-11-21 DIAGNOSIS — Z87891 Personal history of nicotine dependence: Secondary | ICD-10-CM | POA: Diagnosis not present

## 2020-11-21 DIAGNOSIS — E1122 Type 2 diabetes mellitus with diabetic chronic kidney disease: Secondary | ICD-10-CM | POA: Diagnosis not present

## 2020-12-12 DIAGNOSIS — Z0100 Encounter for examination of eyes and vision without abnormal findings: Secondary | ICD-10-CM | POA: Diagnosis not present

## 2020-12-12 DIAGNOSIS — H52229 Regular astigmatism, unspecified eye: Secondary | ICD-10-CM | POA: Diagnosis not present

## 2020-12-12 DIAGNOSIS — I10 Essential (primary) hypertension: Secondary | ICD-10-CM | POA: Diagnosis not present

## 2022-08-02 ENCOUNTER — Telehealth (HOSPITAL_COMMUNITY): Payer: Self-pay | Admitting: *Deleted

## 2022-08-02 NOTE — Telephone Encounter (Signed)
Attempted to contact patient via [employer] to schedule OP MBS. Left VM. RKEEL

## 2022-08-27 ENCOUNTER — Telehealth (HOSPITAL_COMMUNITY): Payer: Self-pay | Admitting: *Deleted

## 2022-08-27 NOTE — Telephone Encounter (Signed)
Unable to contact patient x 3 through [employer], will close order at this time. RKEEL

## 2022-09-14 ENCOUNTER — Other Ambulatory Visit (HOSPITAL_COMMUNITY): Payer: Self-pay | Admitting: *Deleted

## 2022-09-14 DIAGNOSIS — R059 Cough, unspecified: Secondary | ICD-10-CM

## 2022-09-14 DIAGNOSIS — R1310 Dysphagia, unspecified: Secondary | ICD-10-CM

## 2022-09-27 ENCOUNTER — Ambulatory Visit (HOSPITAL_COMMUNITY)
Admission: RE | Admit: 2022-09-27 | Discharge: 2022-09-27 | Disposition: A | Discharge status: Home / Self Care | Payer: Medicare Other | Source: Ambulatory Visit | Attending: Family Medicine | Admitting: Family Medicine

## 2022-09-27 ENCOUNTER — Ambulatory Visit (HOSPITAL_COMMUNITY)
Admission: RE | Admit: 2022-09-27 | Discharge: 2022-09-27 | Disposition: A | Discharge status: Home / Self Care | Payer: Medicare (Managed Care) | Source: Ambulatory Visit | Attending: Family Medicine | Admitting: Family Medicine

## 2022-09-27 DIAGNOSIS — R059 Cough, unspecified: Secondary | ICD-10-CM

## 2022-09-27 DIAGNOSIS — R1319 Other dysphagia: Secondary | ICD-10-CM | POA: Diagnosis present

## 2022-09-27 DIAGNOSIS — T18198A Other foreign object in esophagus causing other injury, initial encounter: Secondary | ICD-10-CM | POA: Diagnosis not present

## 2022-09-27 DIAGNOSIS — R1310 Dysphagia, unspecified: Secondary | ICD-10-CM

## 2022-09-27 NOTE — Progress Notes (Signed)
Modified Barium Swallow Study  Patient Details  Name: Danielle Stone MRN: [id number] Date of Birth: [birth year]  Today's Date: 09/27/2022  Modified Barium Swallow completed.  Full report located under Chart Review in the Imaging Section.  History of Present Illness Pt arrives for an OP MBS due to complaint of solid food dysphagia with increasing need to regurgitate. She reports she has to regurgitate her food almost every time she eats.   Clinical Impression Pt demonstrates no oropharyngeal dysphagia. No food or liquid lodged in oropharynx. No aspiration or penetraton. Upon close review of imaging there appears to be a consistent bolus flow over rounded tissue in the cervical esophagus. This did not obstruct solids or a pill during the study but may warrant direct visualization from gastroenterologist via endoscopy. Pt additionally had a barium tablet lodge in the distal esophagus until a large bolus of puree was given to push further. Again, esophagram and endoscopy may be helpful.     Swallow Evaluation Recommendations Recommendations: PO diet PO Diet Recommendation: Regular;Thin liquids (Level 0) Liquid Administration via: Cup;Straw Medication Administration: Whole meds with liquid Supervision: Patient able to self-feed Oral care recommendations: Oral care BID (2x/day) Recommended consults: Consider esophageal assessment;Consider GI consultation  Harlon Ditty, MA CCC-SLP  [facility] Secure Chat Preferred Office [phone]     Claudine Mouton 09/27/2022,12:38 PM
